# Patient Record
Sex: Male | Born: 1955 | Race: White | Hispanic: No | Marital: Married | Smoking: Former smoker
Health system: Southern US, Community
[De-identification: ages and names within clinical notes are randomized; demographics above are authoritative.]

## PROBLEM LIST (undated history)

## (undated) HISTORY — PX: APPENDECTOMY: SHX54

## (undated) HISTORY — PX: ABDOMINAL SURGERY: SHX537

---

## 2009-06-18 ENCOUNTER — Ambulatory Visit (HOSPITAL_COMMUNITY): Admission: RE | Admit: 2009-06-18 | Discharge: 2009-06-18 | Payer: Self-pay | Admitting: Gastroenterology

## 2010-03-03 ENCOUNTER — Ambulatory Visit (HOSPITAL_COMMUNITY): Admission: RE | Admit: 2010-03-03 | Discharge: 2010-03-03 | Payer: Self-pay | Admitting: General Surgery

## 2010-04-02 ENCOUNTER — Encounter (INDEPENDENT_AMBULATORY_CARE_PROVIDER_SITE_OTHER): Payer: Self-pay | Admitting: General Surgery

## 2010-04-02 ENCOUNTER — Inpatient Hospital Stay (HOSPITAL_COMMUNITY): Admission: RE | Admit: 2010-04-02 | Discharge: 2010-04-06 | Payer: Self-pay | Admitting: General Surgery

## 2010-10-01 LAB — COMPREHENSIVE METABOLIC PANEL
ALT: 18 U/L (ref 0–53)
Alkaline Phosphatase: 37 U/L — ABNORMAL LOW (ref 39–117)
CO2: 30 mEq/L (ref 19–32)
Calcium: 9.1 mg/dL (ref 8.4–10.5)
Chloride: 108 mEq/L (ref 96–112)
GFR calc Af Amer: >60 mL/min (ref >60)

## 2010-10-01 LAB — CBC
Hemoglobin: 11.5 g/dL — ABNORMAL LOW (ref 13.0–17.0)
MCH: 29.7 pg (ref 26.0–34.0)
MCH: 30 pg (ref 26.0–34.0)
MCHC: 33.6 g/dL (ref 30.0–36.0)
MCHC: 33.9 g/dL (ref 30.0–36.0)
MCV: 88.4 fL (ref 78.0–100.0)
Platelets: 195 10*3/uL (ref 150–400)
RDW: 13 % (ref 11.5–15.5)
WBC: 7.8 10*3/uL (ref 4.0–10.5)

## 2010-10-01 LAB — DIFFERENTIAL
Basophils Absolute: 0 10*3/uL (ref 0.0–0.1)
Eosinophils Absolute: 0.1 10*3/uL (ref 0.0–0.7)
Lymphocytes Relative: 30 % (ref 12–46)
Monocytes Absolute: 0.3 10*3/uL (ref 0.1–1.0)
Neutro Abs: 3.2 10*3/uL (ref 1.7–7.7)
Neutrophils Relative %: 62 % (ref 43–77)

## 2010-10-01 LAB — BASIC METABOLIC PANEL
BUN: 9 mg/dL (ref 6–23)
CO2: 31 mEq/L (ref 19–32)
Calcium: 8.5 mg/dL (ref 8.4–10.5)
GFR calc Af Amer: >60 mL/min (ref >60)
GFR calc non Af Amer: >60 mL/min (ref >60)
Glucose, Bld: 98 mg/dL (ref 70–99)
Sodium: 143 mEq/L (ref 135–145)

## 2010-10-02 LAB — DIFFERENTIAL
Basophils Absolute: 0 10*3/uL (ref 0.0–0.1)
Basophils Relative: 0 % (ref 0–1)
Monocytes Absolute: 0.4 10*3/uL (ref 0.1–1.0)
Neutro Abs: 3.4 10*3/uL (ref 1.7–7.7)

## 2010-10-02 LAB — COMPREHENSIVE METABOLIC PANEL
ALT: 22 U/L (ref 0–53)
AST: 19 U/L (ref 0–37)
CO2: 29 mEq/L (ref 19–32)
Calcium: 9.4 mg/dL (ref 8.4–10.5)
Creatinine, Ser: 0.94 mg/dL (ref 0.4–1.5)
GFR calc non Af Amer: >60 mL/min (ref >60)
Sodium: 143 mEq/L (ref 135–145)
Total Bilirubin: 1 mg/dL (ref 0.3–1.2)

## 2010-10-02 LAB — CBC
HCT: 41.6 % (ref 39.0–52.0)
Hemoglobin: 14.2 g/dL (ref 13.0–17.0)
MCV: 91.7 fL (ref 78.0–100.0)
Platelets: 203 10*3/uL (ref 150–400)

## 2012-02-21 IMAGING — CR DG CHEST 2V
2 series · 2 of 2 positions shown · non-contrast
Comparison: None.

CLINICAL DATA: Preoperative respiratory exam; colonic polyps

CHEST - 2 VIEW

[w chest pa]
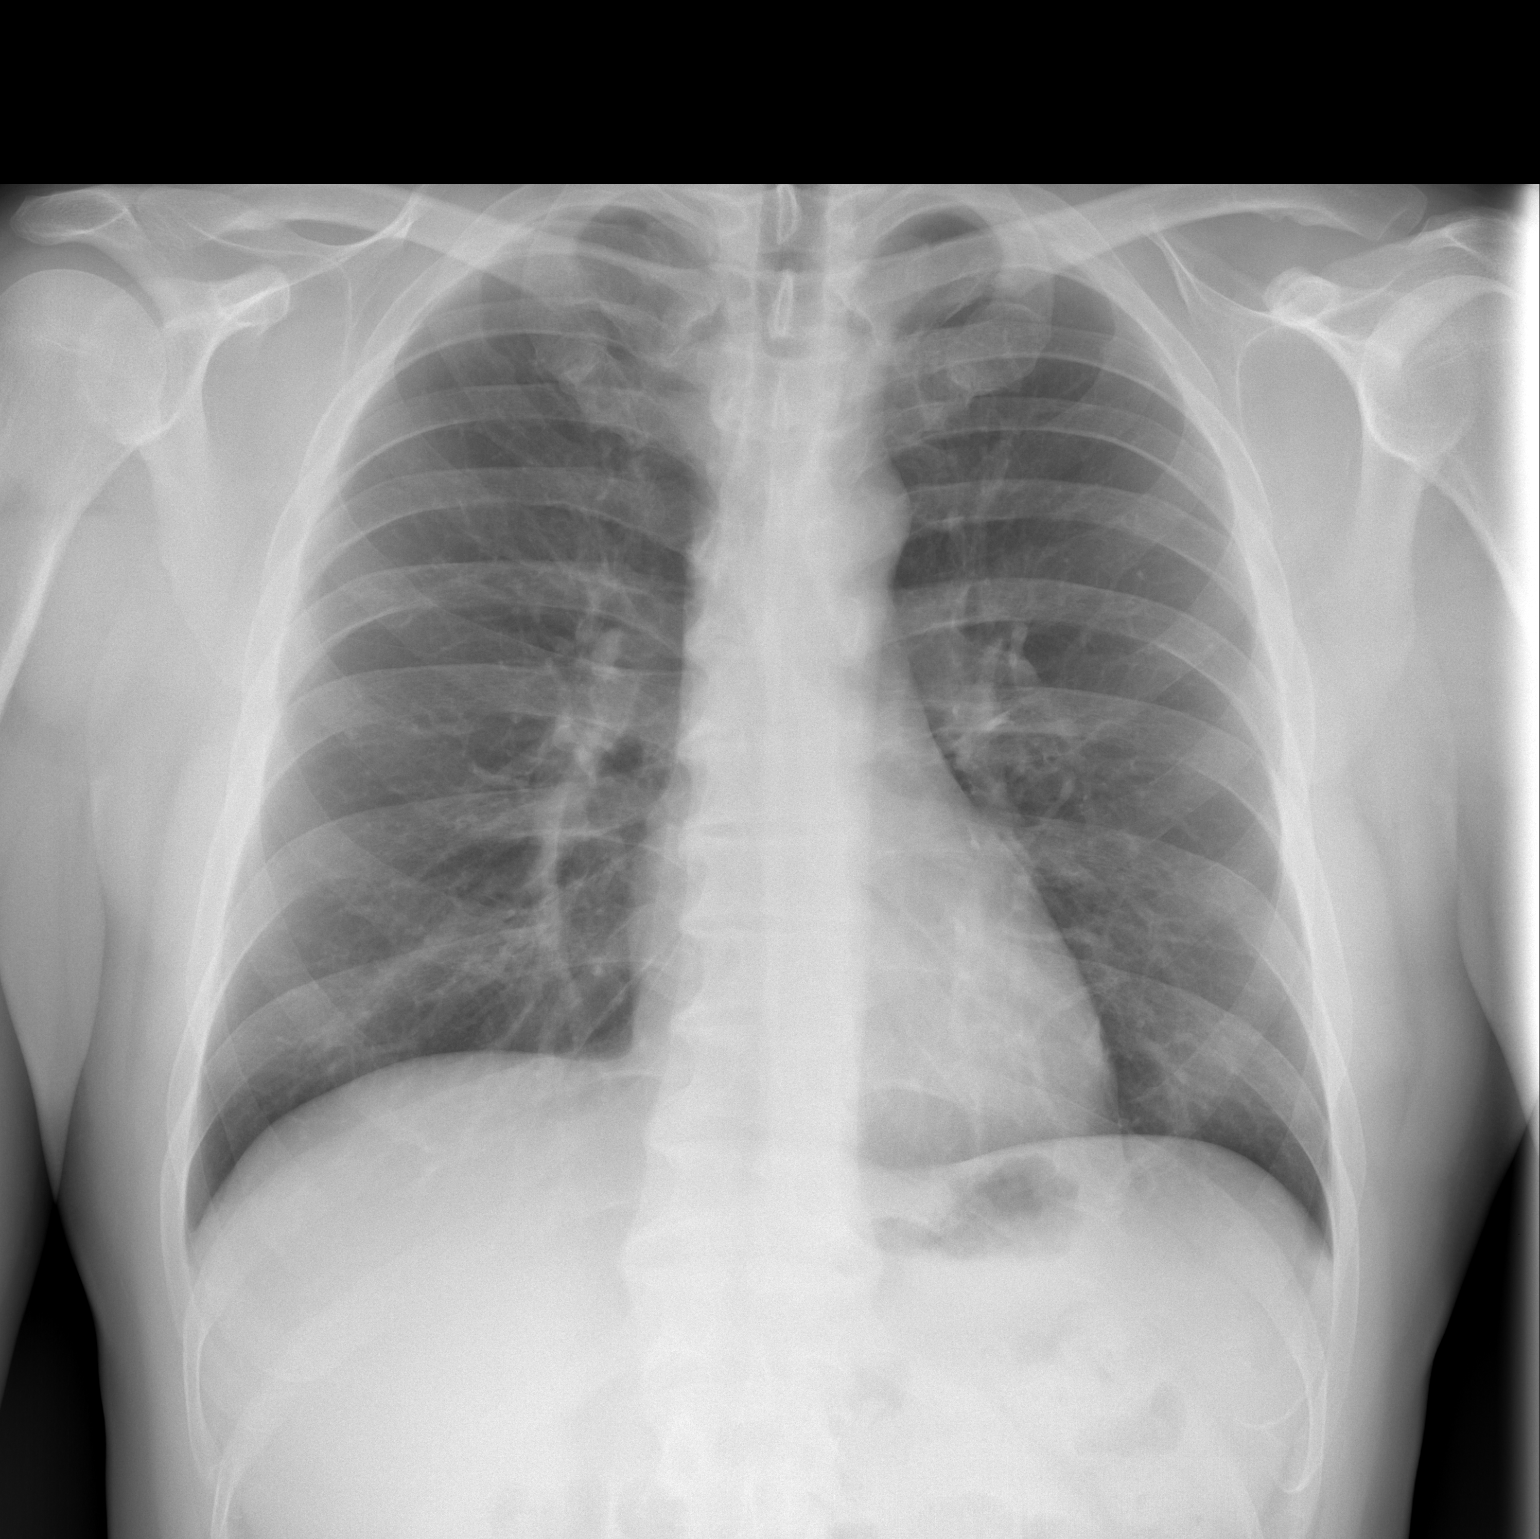

[w chest lat]
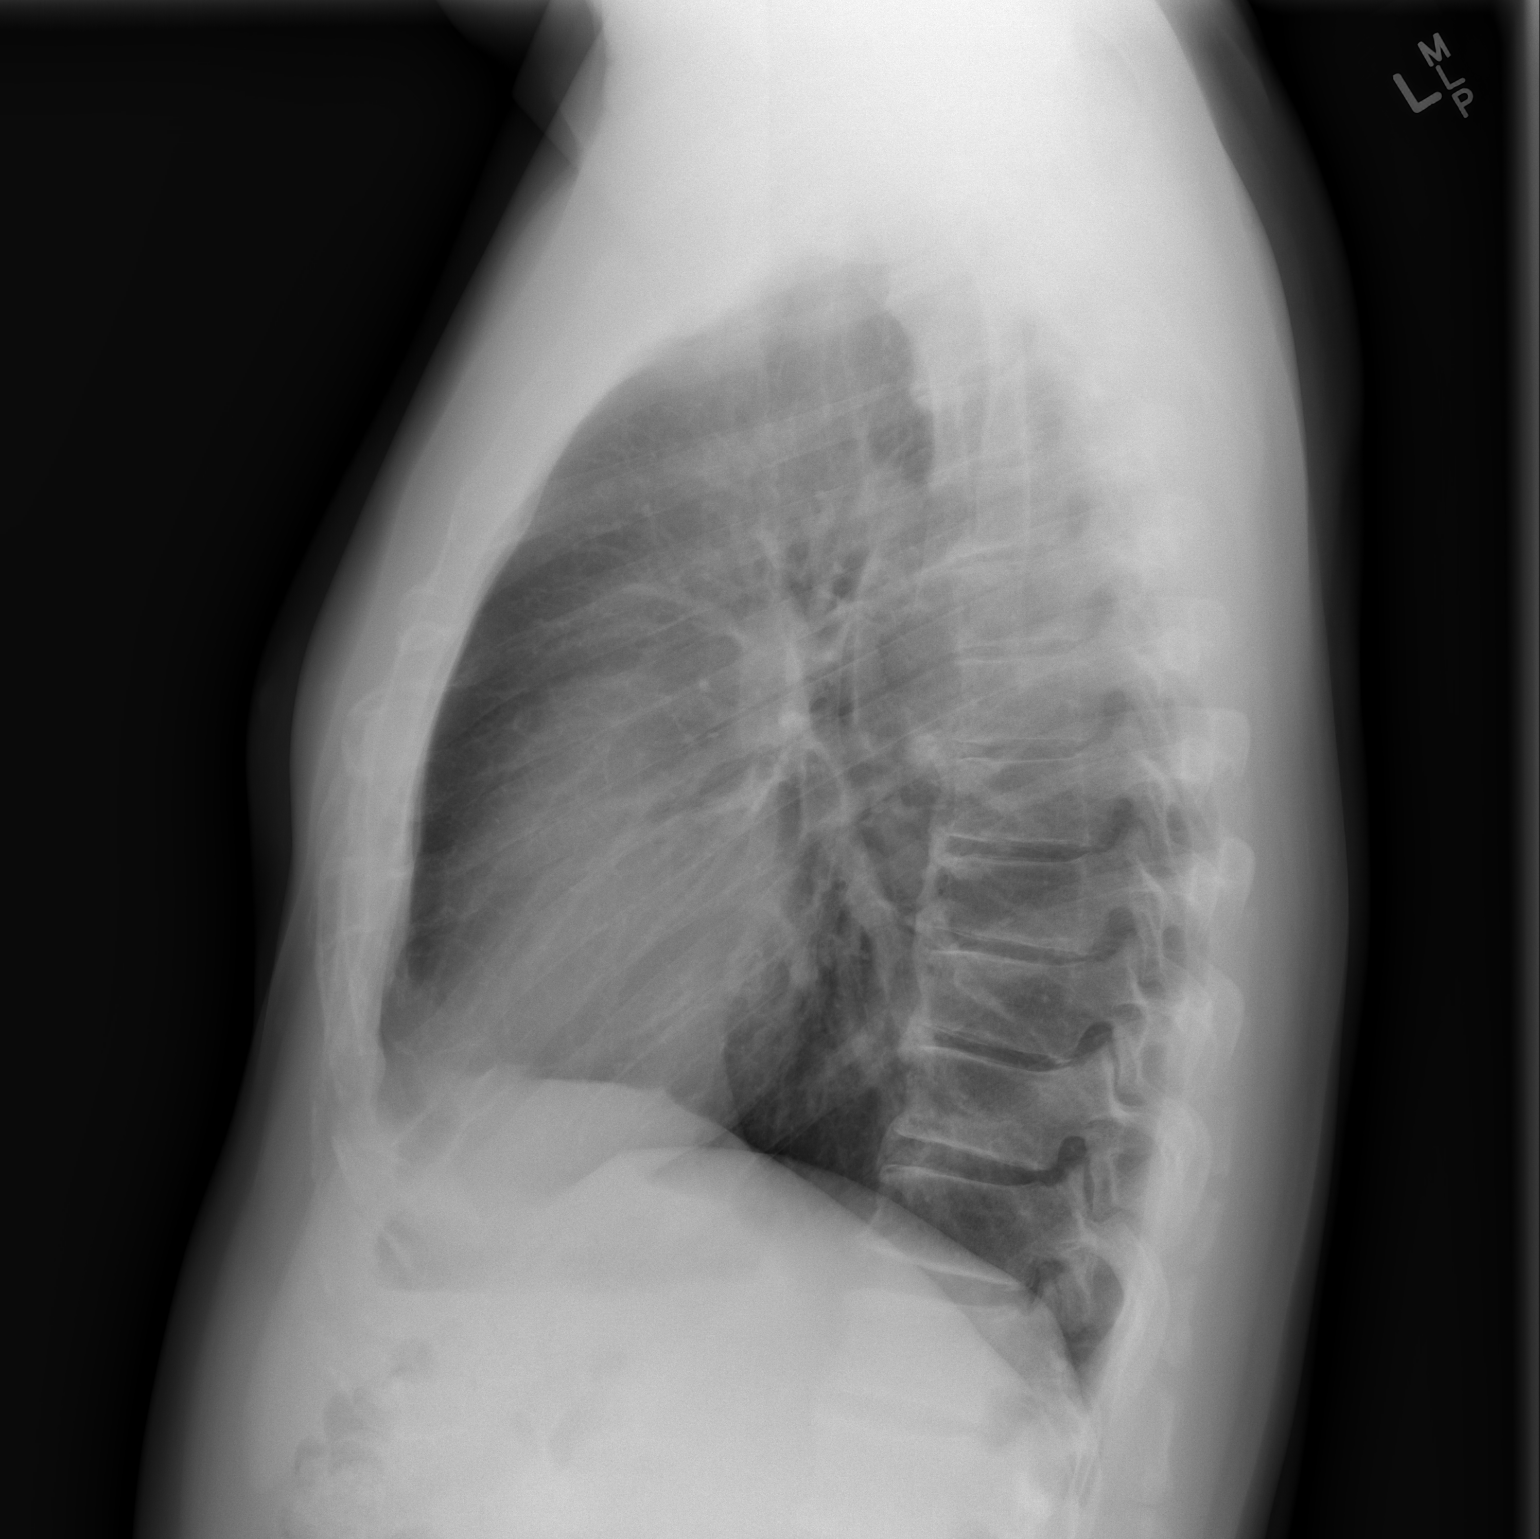

[2 of 2 positions shown; findings below may reference images not displayed]

FINDINGS: The cardiac silhouette, mediastinum, pulmonary
vasculature are within normal limits.  Both lungs are clear.
There is no acute bony abnormality.
IMPRESSION: Normal chest x-ray.

## 2013-01-27 ENCOUNTER — Emergency Department (HOSPITAL_COMMUNITY)
Admission: EM | Admit: 2013-01-27 | Discharge: 2013-01-27 | Disposition: A | Discharge status: Home / Self Care | Payer: Commercial Managed Care - PPO | Attending: Emergency Medicine | Admitting: Emergency Medicine

## 2013-01-27 ENCOUNTER — Emergency Department (HOSPITAL_COMMUNITY): Payer: Commercial Managed Care - PPO

## 2013-01-27 ENCOUNTER — Encounter (HOSPITAL_COMMUNITY): Payer: Self-pay | Admitting: *Deleted

## 2013-01-27 DIAGNOSIS — S7000XA Contusion of unspecified hip, initial encounter: Secondary | ICD-10-CM | POA: Insufficient documentation

## 2013-01-27 DIAGNOSIS — S7001XA Contusion of right hip, initial encounter: Secondary | ICD-10-CM

## 2013-01-27 DIAGNOSIS — S7010XA Contusion of unspecified thigh, initial encounter: Secondary | ICD-10-CM | POA: Insufficient documentation

## 2013-01-27 DIAGNOSIS — Y929 Unspecified place or not applicable: Secondary | ICD-10-CM | POA: Insufficient documentation

## 2013-01-27 DIAGNOSIS — Z87891 Personal history of nicotine dependence: Secondary | ICD-10-CM | POA: Insufficient documentation

## 2013-01-27 DIAGNOSIS — Y939 Activity, unspecified: Secondary | ICD-10-CM | POA: Insufficient documentation

## 2013-01-27 MED ORDER — HYDROMORPHONE HCL PF 1 MG/ML IJ SOLN
1.0000 mg | Freq: Once | INTRAMUSCULAR | Status: AC
  Administered 2013-01-27: 1 mg via INTRAMUSCULAR
  Filled 2013-01-27: qty 1

## 2013-01-27 MED ORDER — HYDROCODONE-ACETAMINOPHEN 5-325 MG PO TABS: 1.0000 | ORAL_TABLET | Freq: Four times a day (QID) | ORAL | Status: AC | PRN

## 2013-01-27 NOTE — ED Provider Notes (Signed)
History    CSN: [id number] Arrival date & time 01/27/13  6213  First MD Initiated Contact with Patient 01/27/13 1928     Chief Complaint  Patient presents with  . Fall   (Consider location/radiation/quality/duration/timing/severity/associated sxs/prior Treatment) Patient is a 57 y.o. male presenting with fall. The history is provided by the patient (pt fell from a horse today and hurt his right hip.  he fell from about 4 feet onto the ground). No language interpreter was used.  Fall This is a new problem. The current episode started 12 to 24 hours ago. The problem occurs constantly. The problem has not changed since onset.Pertinent negatives include no chest pain, no abdominal pain and no headaches. The symptoms are aggravated by bending. Nothing relieves the symptoms.   History reviewed. No pertinent past medical history. Past Surgical History  Procedure Laterality Date  . Abdominal surgery      Colon resection  . Appendectomy     History reviewed. No pertinent family history. History  Substance Use Topics  . Smoking status: Former Smoker    Quit date: 01/28/1983  . Smokeless tobacco: Not on file  . Alcohol Use: Yes     Comment: casually    Review of Systems  Constitutional: Negative for appetite change and fatigue.  HENT: Negative for congestion, sinus pressure and ear discharge.   Eyes: Negative for discharge.  Respiratory: Negative for cough.   Cardiovascular: Negative for chest pain.  Gastrointestinal: Negative for abdominal pain and diarrhea.  Genitourinary: Negative for frequency and hematuria.  Musculoskeletal: Negative for back pain.       Painful right hip  Skin: Negative for rash.  Neurological: Negative for seizures and headaches.  Psychiatric/Behavioral: Negative for hallucinations.    Allergies  Review of patient's allergies indicates no known allergies.  Home Medications   Current Outpatient Rx  Name  Route  Sig  Dispense  Refill  . ibuprofen  (ADVIL,MOTRIN) 200 MG tablet   Oral   Take 200 mg by mouth every 6 (six) hours as needed for pain.         Marland Kitchen HYDROcodone-acetaminophen (NORCO/VICODIN) 5-325 MG per tablet   Oral   Take 1 tablet by mouth every 6 (six) hours as needed for pain.   30 tablet   0    BP 146/72  Pulse 78  Temp(Src) 98.1 F (36.7 C) (Oral)  Resp 18  SpO2 100% Physical Exam  Constitutional: He is oriented to person, place, and time. He appears well-developed.  HENT:  Head: Normocephalic.  Eyes: Conjunctivae and EOM are normal. No scleral icterus.  Neck: Neck supple. No thyromegaly present.  Cardiovascular: Normal rate and regular rhythm.  Exam reveals no gallop and no friction rub.   No murmur heard. Pulmonary/Chest: No stridor. He has no wheezes. He has no rales. He exhibits no tenderness.  Abdominal: He exhibits no distension. There is no tenderness. There is no rebound.  Musculoskeletal: He exhibits no edema.  Tender right hip  Lymphadenopathy:    He has no cervical adenopathy.  Neurological: He is oriented to person, place, and time. Coordination normal.  Skin: No rash noted. No erythema.  Psychiatric: He has a normal mood and affect. His behavior is normal.    ED Course  Procedures (including critical care time) Labs Reviewed - No data to display Dg Hip Complete Right  01/27/2013   *RADIOLOGY REPORT*  Clinical Data: Fall from horse.  Right hip and buttock pain.  RIGHT HIP - COMPLETE 2+ VIEW  Comparison: None.  Findings: No plain film evidence of fracture.   If symptoms persist MR may be considered.  IMPRESSION: No plain film evidence of fracture.  Please see above.   Original Report Authenticated By: Lacy Duverney, M.D.   1. Contusion, hip and thigh, right, initial encounter     MDM  Contusion right hip,  Nl hip plain films.  Pt to follow up with his ortho md  Benny Lennert, MD 01/27/13 2030

## 2013-01-27 NOTE — ED Notes (Signed)
Pt fell from a horse, injuring R hip and R buttock. Pt states pain has gradually worsened and presents using crutches.  Pt partially weight bearing on R leg, unable to ambulate without use of crutches. Pt c/o muscle spasms w/ ambulation.

## 2015-03-18 DIAGNOSIS — J302 Other seasonal allergic rhinitis: Secondary | ICD-10-CM | POA: Insufficient documentation

## 2017-05-23 ENCOUNTER — Ambulatory Visit (INDEPENDENT_AMBULATORY_CARE_PROVIDER_SITE_OTHER): Payer: Commercial Managed Care - PPO

## 2017-05-23 ENCOUNTER — Encounter (INDEPENDENT_AMBULATORY_CARE_PROVIDER_SITE_OTHER): Payer: Self-pay | Admitting: Physician Assistant

## 2017-05-23 ENCOUNTER — Ambulatory Visit (INDEPENDENT_AMBULATORY_CARE_PROVIDER_SITE_OTHER): Payer: Self-pay | Admitting: Physician Assistant

## 2017-05-23 VITALS — Ht 70.0 in | Wt 195.0 lb

## 2017-05-23 DIAGNOSIS — M545 Low back pain: Secondary | ICD-10-CM

## 2017-05-23 DIAGNOSIS — M25551 Pain in right hip: Secondary | ICD-10-CM | POA: Diagnosis not present

## 2017-05-23 NOTE — Progress Notes (Signed)
Office Visit Note   Patient: Billy Mccoy           Date of Birth: [birth year]           MRN: [id number] Visit Date: 05/23/2017              Requested by: No referring provider defined for this encounter. PCP: System, Pcp Not In   Assessment & Plan: Visit Diagnoses:  1. Acute right-sided low back pain, with sciatica presence unspecified   2. Pain in right hip     Plan: We will send him to physical therapy for IT band stretching, hamstring stretching, core strengthening back exercises.  He will follow-up with us on an as-needed basis if pain persist or becomes worse.  Questions were encouraged and answered  Follow-Up Instructions: Return if symptoms worsen or fail to improve.   Orders:  Orders Placed This Encounter  Procedures  . XR Lumbar Spine 2-3 Views  . XR HIP UNILAT W OR W/O PELVIS 2-3 VIEWS RIGHT  . Ambulatory referral to Physical Therapy   No orders of the defined types were placed in this encounter.     Procedures: No procedures performed   Clinical Data: No additional findings.   Subjective: Chief Complaint  Patient presents with  . Lower Back - Pain  . Right Hip - Pain    HPI Billy Mccoy is a 61 year old male who comes in today with one-week history of not being able to sleep on his right hip.  Had some on and off back pain past is having no significant back pain at this point time.  Painful for him to lie on his right side does awaken him around 4 AM.  He denies any numbness or tingling down either leg.  Had no bowel or bladder dysfunction.  He does feel that the right hip pain is made his knee go out a couple of times.  Stress Myoview 5 Profen with some relief. Review of Systems Denies fevers, chills, shortness of breath or chest pain.  Otherwise please see HPI.  Objective: Vital Signs: Ht 5\' 10"  (1.778 m)   Wt 195 lb (88.5 kg)   BMI 27.98 kg/m   Physical Exam  Constitutional: He is oriented to person, place, and time. He appears well-developed  and well-nourished. No distress.  Cardiovascular: Intact distal pulses.  Pulmonary/Chest: Effort normal.  Neurological: He is alert and oriented to person, place, and time.  Skin: He is not diaphoretic.  Psychiatric: He has a normal mood and affect.    Ortho Exam Bilateral hips excellent range of motion without pain.  5 out of 5 strength throughout lower extremities against resistance.  Negative straight leg raise bilaterally.  Deep tendon reflexes are 2+ at the knees and ankles and equal and symmetric.  Sensation intact bilateral feet to light touch.  Dorsal pedal pulses are 2+ bilaterally and equal.  He has exquisitely tight hamstrings.  He is unable to come but just below his toes with forward flexion.  Has no tenderness over the left hip trochanteric region.  Some slight tenderness of the right hip trochanteric region. Specialty Comments:  No specialty comments available.  Imaging: Xr Hip Unilat W Or W/o Pelvis 2-3 Views Right  Result Date: 05/23/2017 AP pelvis and lateral view of the right hip: No acute fractures.  Bilateral hips are well located.  Hip joints are well-preserved.  No other bony abnormalities.  Xr Lumbar Spine 2-3 Views  Result Date: 05/23/2017 Lumbar spine AP lateral  views: Loss of lordotic curvature.  Endplate spurring throughout.  Disc space otherwise well-maintained.  No spondylolisthesis.  No acute fractures.    PMFS History: Patient Active Problem List   Diagnosis Date Noted  . Allergic rhinitis, seasonal 03/18/2015   History reviewed. No pertinent past medical history.  History reviewed. No pertinent family history.  Past Surgical History:  Procedure Laterality Date  . ABDOMINAL SURGERY     Colon resection  . APPENDECTOMY     Social History   Occupational History  . Not on file  Tobacco Use  . Smoking status: Former Smoker    Last attempt to quit: 01/28/1983    Years since quitting: 34.3  . Smokeless tobacco: Never Used  Substance and Sexual  Activity  . Alcohol use: Yes    Comment: casually  . Drug use: No  . Sexual activity: Yes    Birth control/protection: None

## 2017-05-26 ENCOUNTER — Telehealth: Payer: Self-pay | Admitting: Physical Therapy

## 2017-05-26 NOTE — Telephone Encounter (Signed)
05/26/17 pt wants to wait on PT

## 2017-05-31 NOTE — Telephone Encounter (Signed)
05/31/17 pt wants to hold PT - [employer] term 05/18/17

## 2018-11-30 ENCOUNTER — Other Ambulatory Visit: Payer: Self-pay

## 2018-11-30 ENCOUNTER — Emergency Department (HOSPITAL_COMMUNITY)
Admission: EM | Admit: 2018-11-30 | Discharge: 2018-11-30 | Disposition: A | Discharge status: Home / Self Care | Payer: BLUE CROSS/BLUE SHIELD | Attending: Emergency Medicine | Admitting: Emergency Medicine

## 2018-11-30 ENCOUNTER — Emergency Department (HOSPITAL_COMMUNITY): Payer: BLUE CROSS/BLUE SHIELD

## 2018-11-30 DIAGNOSIS — Z87891 Personal history of nicotine dependence: Secondary | ICD-10-CM | POA: Diagnosis not present

## 2018-11-30 DIAGNOSIS — R2681 Unsteadiness on feet: Secondary | ICD-10-CM | POA: Diagnosis not present

## 2018-11-30 DIAGNOSIS — H532 Diplopia: Secondary | ICD-10-CM | POA: Insufficient documentation

## 2018-11-30 DIAGNOSIS — G529 Cranial nerve disorder, unspecified: Secondary | ICD-10-CM

## 2018-11-30 LAB — BASIC METABOLIC PANEL
Anion gap: 11 (ref 5–15)
BUN: 12 mg/dL (ref 6–20)
CO2: 27 mmol/L (ref 22–32)
Calcium: 9.4 mg/dL (ref 8.9–10.3)
Chloride: 102 mmol/L (ref 98–111)
Creatinine, Ser: 1.17 mg/dL (ref 0.61–1.24)
GFR calc Af Amer: >60 mL/min (ref >60)
GFR calc non Af Amer: >60 mL/min (ref >60)
Glucose, Bld: 195 mg/dL — ABNORMAL HIGH (ref 70–99)
Potassium: 3.7 mmol/L (ref 3.5–5.1)
Sodium: 140 mmol/L (ref 135–145)

## 2018-11-30 LAB — CBC WITH DIFFERENTIAL/PLATELET
Abs Immature Granulocytes: 0.06 10*3/uL (ref 0.00–0.07)
Basophils Absolute: 0 10*3/uL (ref 0.0–0.1)
Basophils Relative: 0 %
Eosinophils Absolute: 0 10*3/uL (ref 0.0–0.5)
Eosinophils Relative: 0 %
HCT: 44.5 % (ref 39.0–52.0)
Hemoglobin: 15.2 g/dL (ref 13.0–17.0)
Immature Granulocytes: 1 %
Lymphocytes Relative: 7 %
Lymphs Abs: 0.6 10*3/uL — ABNORMAL LOW (ref 0.7–4.0)
MCH: 31 pg (ref 26.0–34.0)
MCHC: 34.2 g/dL (ref 30.0–36.0)
MCV: 90.6 fL (ref 80.0–100.0)
Monocytes Absolute: 0 10*3/uL — ABNORMAL LOW (ref 0.1–1.0)
Monocytes Relative: 0 %
Neutro Abs: 7.7 10*3/uL (ref 1.7–7.7)
Neutrophils Relative %: 92 %
Platelets: 268 10*3/uL (ref 150–400)
RBC: 4.91 MIL/uL (ref 4.22–5.81)
RDW: 12.5 % (ref 11.5–15.5)
WBC: 8.5 10*3/uL (ref 4.0–10.5)
nRBC: 0 % (ref 0.0–0.2)

## 2018-11-30 NOTE — Discharge Instructions (Signed)
You presented to the ED with double vision and unsteady gait. Your MRI was normal. This is most likely due to a cranial nerve palsy that should improve with time. I want you to follow up with neurology and opthalmology within one week.

## 2018-11-30 NOTE — ED Provider Notes (Signed)
[facility] Provider [facility]   CSN: [id number] Arrival date & time: 11/30/18  1216    History   Chief Complaint Chief Complaint  Patient presents with  . Diplopia  . Unsteadt Gait    HPI Billy Mccoy is a 63 y.o. male with no significant past medical history presenting to the ED with double vision and unsteady gait that started Tuesday.  Patient said that he was driving to work on Tuesday morning when all of a sudden he felt everything was moving slow motion.  He states once he got to work he felt okay with an around 10 AM he started having unsteady gait and was staggering.  He left work early and napped which helped his symptoms.  He felt that the room was spinning.  He endorsed 3 episodes of vomiting and nausea.  He had a telemetry doc visit on Tuesday and was prescribed meclizine, prednisone and Zofran.  He also took some muscle relaxers Tuesday and Wednesday because he pulled a muscle in his right hip.  States that his symptoms improved significantly on Wednesday with these medications however he started having double vision. He feels unsteady ambulating but if covers one eye his double vision improves.  He states the spinning is gone now but the double vision is what prompted him to visit his PCP and eye doctor today.  His PCP performed Dix-Hallpike maneuver and was unable to induce symptoms. He was concerned about patients stacked vision so sent him to Optho. 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 Recommended he come to the ED to rule out stroke or other causes.      HPI  No past medical history on file.  Patient Active Problem List   Diagnosis Date Noted  . Allergic rhinitis, seasonal 03/18/2015    Past Surgical History:  Procedure Laterality Date  . ABDOMINAL SURGERY     Colon resection  . APPENDECTOMY          Home Medications    Prior to Admission  medications   Medication Sig Start Date End Date Taking? Authorizing Provider  HYDROcodone-acetaminophen (NORCO/VICODIN) 5-325 MG per tablet Take 1 tablet by mouth every 6 (six) hours as needed for pain. Patient not taking: Reported on 05/23/2017 01/27/13   Bethann BerkshireZammit, Joseph, MD  ibuprofen (ADVIL,MOTRIN) 200 MG tablet Take 200 mg by mouth every 6 (six) hours as needed for pain.    [provider]  loratadine (CLARITIN) 10 MG tablet Take by mouth.    [provider]  sildenafil (REVATIO) 20 MG tablet TAKE 3 TABLETS DAILY AS NEEDED PRIOR TO SEX 05/12/17   [provider]    Family History No family history on file.  Social History Social History   Tobacco Use  . Smoking status: Former Smoker    Last attempt to quit: 01/28/1983    Years since quitting: 35.8  . Smokeless tobacco: Never Used  Substance Use Topics  . Alcohol use: Yes    Comment: casually  . Drug use: No     Allergies   Patient has no known allergies.   Review of Systems Review of Systems  Constitutional: Negative for chills, diaphoresis, fatigue and fever.  HENT: Negative for congestion, ear discharge, ear pain, facial swelling, hearing loss, rhinorrhea, sinus pressure, sinus pain, sore throat and tinnitus.   Eyes: Positive for visual disturbance. Negative for photophobia, pain, discharge, redness and itching.  Respiratory: Negative for cough,  chest tightness and shortness of breath.   Cardiovascular: Negative for chest pain.  Gastrointestinal: Negative for abdominal pain, nausea and vomiting.  Genitourinary: Negative for difficulty urinating, frequency and urgency.  Musculoskeletal: Positive for gait problem. Negative for myalgias.  Neurological: Positive for dizziness. Negative for weakness, light-headedness, numbness and headaches.  Psychiatric/Behavioral: Negative for confusion.     Physical Exam Updated Vital Signs BP 131/76   Pulse 85   Temp 98.4 F (36.9 C) (Oral)   Resp 14    Ht  (1.778 m)   Wt 88.5 kg   SpO2 99%   BMI 27.98 kg/m   Physical Exam Constitutional:      General: He is not in acute distress.    Appearance: Normal appearance. He is normal weight. He is not ill-appearing.  HENT:     Head: Normocephalic and atraumatic.     Nose: No congestion or rhinorrhea.     Mouth/Throat:     Mouth: Mucous membranes are dry.     Pharynx: No oropharyngeal exudate or posterior oropharyngeal erythema.  Eyes:     General: Lids are normal.     Extraocular Movements: Extraocular movements intact.     Right eye: Nystagmus present.     Left eye: Nystagmus present.     Conjunctiva/sclera: Conjunctivae normal.     Pupils: Pupils are equal, round, and reactive to light.  Cardiovascular:     Rate and Rhythm: Normal rate and regular rhythm.     Pulses: Normal pulses.     Heart sounds: Normal heart sounds. No murmur.  Pulmonary:     Effort: Pulmonary effort is normal. No respiratory distress.     Breath sounds: Normal breath sounds. No wheezing or rales.  Abdominal:     General: Abdomen is flat. Bowel sounds are normal. There is no distension.     Tenderness: There is no abdominal tenderness.  Musculoskeletal:        General: No swelling or tenderness.  Skin:    General: Skin is warm and dry.  Neurological:     General: No focal deficit present.     Mental Status: He is alert and oriented to person, place, and time.     Cranial Nerves: No cranial nerve deficit.     Sensory: No sensory deficit.     Motor: No weakness.  Psychiatric:        Mood and Affect: Mood normal.        Behavior: Behavior normal.        Thought Content: Thought content normal.        Judgment: Judgment normal.      ED Treatments / Results  Labs (all labs ordered are listed, but only abnormal results are displayed) Labs Reviewed  BASIC METABOLIC PANEL - Abnormal; Notable for the following components:      Result Value   Glucose, Bld 195 (*)    All other components within  normal limits  CBC WITH DIFFERENTIAL/PLATELET - Abnormal; Notable for the following components:   Lymphs Abs 0.6 (*)    Monocytes Absolute 0.0 (*)    All other components within normal limits    EKG None  Radiology Mr Brain Wo Contrast  Result Date: 11/30/2018 CLINICAL DATA:  Three days of vertigo and diplopia. EXAM: MRI HEAD WITHOUT CONTRAST TECHNIQUE: Multiplanar, multiecho pulse sequences of the brain and surrounding structures were obtained without intravenous contrast. COMPARISON:  None. FINDINGS: Brain: No acute infarction, hemorrhage, hydrocephalus, extra-axial collection or mass lesion. Normal white  matter. Vascular: Normal arterial flow voids. Skull and upper cervical spine: Negative Sinuses/Orbits: Mild mucosal edema paranasal sinuses.  Normal orbit. Other: None IMPRESSION: Negative MRI head without contrast. Electronically Signed   By: Marlan Palau M.D.   On: 11/30/2018 15:02    Procedures Procedures (including critical care time)  Medications Ordered in ED Medications - No data to display   Initial Impression / Assessment and Plan / ED Course  I have reviewed the triage vital signs and the nursing notes.  Pertinent labs & imaging results that were available during my care of the patient were reviewed by me and considered in my medical decision making (see chart for details).   Pt is a 63 year old male with no significant PMHx presenting to the ED double vision, dizziness and unsteady gait that started Tuesday. He was prescribed prednisone, meclizine and zofran which helped improve his symptoms. However, he started having double vision on Wednesday which prompted him to see his PCP and optho. His PCP performed Dix-Hallpike and was unable to induce symptoms. He referred him to optho due to double vision. 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 Recommended he come to the ED to rule out stroke or other  causes.   On neuro exam, appreciated bilateral lateral and vertical nystagmus but no focal deficit or palsy appreciated. Cranial nerves intact. CBC and BMP wnl. MRI brain was negative, no acute infarction, hemorrhage, hydrocephalus, mass or lesion. Neurology recommended close follow up. This is likely a peripheral palsy that should improve with time. Discussed with patient he can continue using medications if they are helping and covering his eye to help him ambulate. However, to limit covering his eyes as this will delay healing. Patient is stable for discharge home with close follow up with neurology and optho.   Final Clinical Impressions(s) / ED Diagnoses   Final diagnoses:  Diplopia  Cranial nerve palsy    ED Discharge Orders    None       Sadie Hazelett N, DO 11/30/18 1533    Tilden Fossa, MD 12/01/18 [phone]

## 2018-11-30 NOTE — ED Notes (Signed)
Pt in MRI.

## 2018-11-30 NOTE — ED Triage Notes (Signed)
Patient arrived from home with reports of double vision and unsteady gait that started on Tuesday morning. He reports that on his way to work on Tuesday, patient reports he was having "slowed vision", when asked to explain, he states "kinda like the world was not keeping up with my eyes." About 10 am on the same Tuesday, he was unable to walk, "stumbling a little bit," so he went home. Tuesday afternoon, when he woke up from nap, he was "spinging" and vomited 3 times. He took zofran "which helped."  He saw a Telehealth doctor on Tuesday evening, who gave him some prescription. He reports that his symptoms got better except for the double vision which is why he is here.

## 2018-12-04 ENCOUNTER — Ambulatory Visit: Payer: BLUE CROSS/BLUE SHIELD | Admitting: Orthopaedic Surgery

## 2018-12-05 ENCOUNTER — Encounter: Payer: Self-pay | Admitting: Neurology

## 2018-12-18 ENCOUNTER — Ambulatory Visit: Payer: BLUE CROSS/BLUE SHIELD | Admitting: Orthopaedic Surgery

## 2018-12-18 NOTE — Progress Notes (Signed)
NEUROLOGY CONSULTATION NOTE  TILER BRANDIS MRN: [id number] DOB: [birth year]  Referring provider: Tilden Fossa, MD (ED referral) Primary care provider: Kandyce Rud, MD  Reason for consult:  Cranial nerve deficits  HISTORY OF PRESENT ILLNESS: Billy Mccoy is a 63 year old left-handed Caucasian man who presents for cranial nerve deficits.  History supplemented by ED and PCP notes.  On 11/28/18, he was driving to work when he felt swimmy-headed.  Later in the day, he developed severe vertigo, spinning sensation, associated with nausea and 3 episodes of vomiting.  The spinning was persistent and not positional.  It lasted about 3 hours.  There was no associated headache, visual disturbance, facial droop, slurred speech or unilateral numbness or weakness.  They next day, he developed vertical diplopia which resolved when covering either eye.  The vertigo was gone. On 11/30/18, he followed up with his PCP who performed Dix-Hallpike, which was negative.  He was sent to his ophthalmologist who diagnosed wiwth with acute onset vertical diplopia, cyclo nystagmus and right inferior oblique paralysis.  He was advised to go immediately to the ED for further evaluation.  MRI of brain without contrast was personally reviewed and was negative for acute findings. CBC and CMP unremarkable except for elevated glucose of 195.  Inpatient neurology suspected a peripheral nerve palsy and was discharged home with close outpatient neurology follow up.  He took prednisone.  The diplopia lasted about 2 days and resolved.  He has been feeling well except last week, he fell off his horse and is now experiencing some musculoskeletal back and hip pain.  He followed up with his eye doctor and exam was normal.  PAST MEDICAL HISTORY: No past medical history on file.  PAST SURGICAL HISTORY: Past Surgical History:  Procedure Laterality Date  . ABDOMINAL SURGERY     Colon resection  . APPENDECTOMY      MEDICATIONS:  Current Outpatient Medications on File Prior to Visit  Medication Sig Dispense Refill  . HYDROcodone-acetaminophen (NORCO/VICODIN) 5-325 MG per tablet Take 1 tablet by mouth every 6 (six) hours as needed for pain. (Patient not taking: Reported on 05/23/2017) 30 tablet 0  . ibuprofen (ADVIL,MOTRIN) 200 MG tablet Take 200 mg by mouth every 6 (six) hours as needed for pain.    Marland Kitchen loratadine (CLARITIN) 10 MG tablet Take by mouth.    . sildenafil (REVATIO) 20 MG tablet TAKE 3 TABLETS DAILY AS NEEDED PRIOR TO SEX  5   No current facility-administered medications on file prior to visit.     ALLERGIES: No Known Allergies  FAMILY HISTORY: Family History  Problem Relation Age of Onset  . Dementia Father        Lewey Body   SOCIAL HISTORY: Social History   Socioeconomic History  . Marital status: Married    Spouse name: Not on file  . Number of children: Not on file  . Years of education: Not on file  . Highest education level: Not on file  Occupational History  . Not on file  Social Needs  . Financial resource strain: Not on file  . Food insecurity:    Worry: Not on file    Inability: Not on file  . Transportation needs:    Medical: Not on file    Non-medical: Not on file  Tobacco Use  . Smoking status: Former Smoker    Last attempt to quit: 01/28/1983    Years since quitting: 35.9  . Smokeless tobacco: Never Used  Substance and Sexual  Activity  . Alcohol use: Yes    Comment: casually  . Drug use: No  . Sexual activity: Yes    Birth control/protection: None  Lifestyle  . Physical activity:    Days per week: Not on file    Minutes per session: Not on file  . Stress: Not on file  Relationships  . Social connections:    Talks on phone: Not on file    Gets together: Not on file    Attends religious service: Not on file    Active member of club or organization: Not on file    Attends meetings of clubs or organizations: Not on file    Relationship status: Not on file  .  Intimate partner violence:    Fear of current or ex partner: Not on file    Emotionally abused: Not on file    Physically abused: Not on file    Forced sexual activity: Not on file  Other Topics Concern  . Not on file  Social History Narrative  . Not on file    REVIEW OF SYSTEMS: Constitutional: No fevers, chills, or sweats, no generalized fatigue, change in appetite Eyes: No visual changes, double vision, eye pain Ear, nose and throat: No hearing loss, ear pain, nasal congestion, sore throat Cardiovascular: No chest pain, palpitations Respiratory:  No shortness of breath at rest or with exertion, wheezes GastrointestinaI: No nausea, vomiting, diarrhea, abdominal pain, fecal incontinence Genitourinary:  No dysuria, urinary retention or frequency Musculoskeletal:  No neck pain, back pain Integumentary: No rash, pruritus, skin lesions Neurological: as above Psychiatric: No depression, insomnia, anxiety Endocrine: No palpitations, fatigue, diaphoresis, mood swings, change in appetite, change in weight, increased thirst Hematologic/Lymphatic:  No purpura, petechiae. Allergic/Immunologic: no itchy/runny eyes, nasal congestion, recent allergic reactions, rashes  PHYSICAL EXAM: Blood pressure 128/72, pulse 94, temperature 98.4 F (36.9 C), height 5\' 10"  (1.778 m), weight 198 lb (89.8 kg), SpO2 96 %. General: No acute distress.  Patient appears well-groomed.  Head:  Normocephalic/atraumatic Eyes:  fundi examined but not visualized Neck: supple, no paraspinal tenderness, full range of motion Back: No paraspinal tenderness Heart: regular rate and rhythm Lungs: Clear to auscultation bilaterally. Vascular: No carotid bruits. Neurological Exam: Mental status: alert and oriented to person, place, and time, recent and remote memory intact, fund of knowledge intact, attention and concentration intact, speech fluent and not dysarthric, language intact. Cranial nerves: CN I: not tested CN II:  pupils equal, round and reactive to light, visual fields intact CN III, IV, VI:  full range of motion, no nystagmus, no ptosis CN V: facial sensation intact CN VII: upper and lower face symmetric CN VIII: hearing intact CN IX, X: gag intact, uvula midline CN XI: sternocleidomastoid and trapezius muscles intact CN XII: tongue midline Bulk & Tone: normal, no fasciculations. Motor:  5/5 throughout  Sensation: temperature and vibration sensation intact. Deep Tendon Reflexes:  2+ throughout, toes downgoing.  Finger to nose testing:  Without dysmetria.  Heel to shin:  Without dysmetria.  Gait:  Mildly antalgic but steady gait. Romberg negative.  IMPRESSION: 1.  Given the episode of vertigo followed by 2 to 3 days of vertical diplopia without vertical, I would be concerned about a possible MRI-negative brainstem infarct rather than a cranial nerve palsy. 2.  Hyperglycemia  PLAN: 1.  Start aspirin 81mg  daily 2.  We will check CTA of head and neck 3.  We will check 2D echocardiogram with bubble study 4.  Check lipid panel and Hgb  A1c 5.  Follow up in 4 months.  Thank you for allowing me to take part in the care of this patient.  Shon MilletAdam Aniayah Alaniz, DO  CC: Kandyce RudMarcus Babaoff, MD

## 2018-12-19 ENCOUNTER — Other Ambulatory Visit (INDEPENDENT_AMBULATORY_CARE_PROVIDER_SITE_OTHER): Payer: BC Managed Care – PPO

## 2018-12-19 ENCOUNTER — Other Ambulatory Visit: Payer: Self-pay

## 2018-12-19 ENCOUNTER — Ambulatory Visit (INDEPENDENT_AMBULATORY_CARE_PROVIDER_SITE_OTHER): Payer: BC Managed Care – PPO | Admitting: Neurology

## 2018-12-19 ENCOUNTER — Encounter: Payer: Self-pay | Admitting: Neurology

## 2018-12-19 VITALS — BP 128/72 | HR 94 | Temp 98.4°F | Ht 70.0 in | Wt 198.0 lb

## 2018-12-19 DIAGNOSIS — R739 Hyperglycemia, unspecified: Secondary | ICD-10-CM

## 2018-12-19 DIAGNOSIS — R6889 Other general symptoms and signs: Secondary | ICD-10-CM

## 2018-12-19 DIAGNOSIS — H532 Diplopia: Secondary | ICD-10-CM

## 2018-12-19 DIAGNOSIS — R42 Dizziness and giddiness: Secondary | ICD-10-CM | POA: Diagnosis not present

## 2018-12-19 LAB — LIPID PANEL
Cholesterol: 162 mg/dL (ref 0–200)
HDL: 49.6 mg/dL (ref >39.00)
LDL Cholesterol: 99 mg/dL (ref 0–99)
NonHDL: 112.51
Total CHOL/HDL Ratio: 3
Triglycerides: 70 mg/dL (ref 0.0–149.0)
VLDL: 14 mg/dL (ref 0.0–40.0)

## 2018-12-19 LAB — HEMOGLOBIN A1C: Hgb A1c MFr Bld: 5.6 % (ref 4.6–6.5)

## 2018-12-19 NOTE — Patient Instructions (Addendum)
To err on side of caution, I would like to treat for possible small stroke. 1.  Start aspirin 81mg  daily 2.  We will check CTA of head and neck 3.  We will check 2D echocardiogram with bubble study 4.  Check lipid panel and Hgb A1c 5.  Follow up in 4 months.  Your provider has requested that you have labwork completed today. Please go to [facility] ([id number]) on the second floor of this building before leaving the office today. You do not need to check in. If you are not called within 15 minutes please check with the front desk.   We have sent a referral to [facility] for your CTA's and they will call you directly to schedule your appointment. They are located at [address] [facility]. If you need to contact them directly please call [phone].  You will be contacted directly to schedule the Echocardiogram by cardiology

## 2018-12-21 ENCOUNTER — Telehealth: Payer: Self-pay

## 2018-12-21 NOTE — Telephone Encounter (Signed)
Called and advised Pt of lab results 

## 2018-12-21 NOTE — Telephone Encounter (Signed)
-----   Message from Drema Dallas, DO sent at 12/20/2018 12:27 PM EDT ----- Labs are acceptable.  No further recommendations

## 2019-04-24 ENCOUNTER — Ambulatory Visit: Payer: BC Managed Care – PPO | Admitting: Neurology
